# Patient Record
Sex: Male | Born: 1955 | Race: White | Hispanic: No | Marital: Married | Smoking: Never smoker
Health system: Southern US, Community
[De-identification: ages and names within clinical notes are randomized; demographics above are authoritative.]

---

## 2006-05-10 ENCOUNTER — Emergency Department (HOSPITAL_COMMUNITY): Admission: EM | Admit: 2006-05-10 | Discharge: 2006-05-10 | Payer: Self-pay | Admitting: Emergency Medicine
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
## 2014-10-29 DIAGNOSIS — Z800 Family history of malignant neoplasm of digestive organs: Secondary | ICD-10-CM | POA: Insufficient documentation

## 2015-03-24 DIAGNOSIS — D126 Benign neoplasm of colon, unspecified: Secondary | ICD-10-CM | POA: Insufficient documentation

## 2016-07-02 DIAGNOSIS — F4310 Post-traumatic stress disorder, unspecified: Secondary | ICD-10-CM | POA: Insufficient documentation

## 2017-02-04 DIAGNOSIS — M19022 Primary osteoarthritis, left elbow: Secondary | ICD-10-CM | POA: Insufficient documentation

## 2018-06-09 DIAGNOSIS — I10 Essential (primary) hypertension: Secondary | ICD-10-CM | POA: Insufficient documentation

## 2018-06-09 DIAGNOSIS — Z8673 Personal history of transient ischemic attack (TIA), and cerebral infarction without residual deficits: Secondary | ICD-10-CM | POA: Insufficient documentation

## 2018-09-24 DIAGNOSIS — G4733 Obstructive sleep apnea (adult) (pediatric): Secondary | ICD-10-CM | POA: Insufficient documentation

## 2018-09-24 DIAGNOSIS — I272 Pulmonary hypertension, unspecified: Secondary | ICD-10-CM | POA: Insufficient documentation

## 2019-04-08 DIAGNOSIS — Q2112 Patent foramen ovale: Secondary | ICD-10-CM | POA: Insufficient documentation

## 2019-04-08 DIAGNOSIS — Q211 Atrial septal defect: Secondary | ICD-10-CM | POA: Insufficient documentation

## 2019-09-02 DIAGNOSIS — R310 Gross hematuria: Secondary | ICD-10-CM | POA: Insufficient documentation

## 2019-09-02 DIAGNOSIS — N135 Crossing vessel and stricture of ureter without hydronephrosis: Secondary | ICD-10-CM | POA: Insufficient documentation

## 2019-09-25 DIAGNOSIS — N2889 Other specified disorders of kidney and ureter: Secondary | ICD-10-CM | POA: Insufficient documentation

## 2019-12-01 ENCOUNTER — Ambulatory Visit: Payer: Self-pay | Admitting: Orthopaedic Surgery

## 2019-12-08 ENCOUNTER — Encounter: Payer: Self-pay | Admitting: Orthopaedic Surgery

## 2019-12-08 ENCOUNTER — Ambulatory Visit (INDEPENDENT_AMBULATORY_CARE_PROVIDER_SITE_OTHER): Payer: Self-pay | Admitting: Orthopaedic Surgery

## 2019-12-08 ENCOUNTER — Other Ambulatory Visit: Payer: Self-pay

## 2019-12-08 VITALS — BP 145/83 | HR 50 | Ht 68.0 in | Wt 215.0 lb

## 2019-12-08 DIAGNOSIS — M19079 Primary osteoarthritis, unspecified ankle and foot: Secondary | ICD-10-CM | POA: Insufficient documentation

## 2019-12-08 DIAGNOSIS — M542 Cervicalgia: Secondary | ICD-10-CM

## 2019-12-08 DIAGNOSIS — M25522 Pain in left elbow: Secondary | ICD-10-CM

## 2019-12-08 DIAGNOSIS — M25512 Pain in left shoulder: Secondary | ICD-10-CM

## 2019-12-08 DIAGNOSIS — M722 Plantar fascial fibromatosis: Secondary | ICD-10-CM | POA: Insufficient documentation

## 2019-12-08 DIAGNOSIS — B0089 Other herpesviral infection: Secondary | ICD-10-CM | POA: Insufficient documentation

## 2019-12-08 DIAGNOSIS — G8929 Other chronic pain: Secondary | ICD-10-CM

## 2019-12-08 DIAGNOSIS — K2270 Barrett's esophagus without dysplasia: Secondary | ICD-10-CM | POA: Insufficient documentation

## 2019-12-08 DIAGNOSIS — Z1211 Encounter for screening for malignant neoplasm of colon: Secondary | ICD-10-CM | POA: Insufficient documentation

## 2019-12-08 NOTE — Progress Notes (Signed)
He was seen for separate second opinion and rating.    Separate letter written to attorney.  Electronically Signed Sanjuana Kava, MD 1/5/202111:01 AM

## 2020-11-30 ENCOUNTER — Telehealth: Payer: Self-pay | Admitting: Orthopaedic Surgery

## 2020-11-30 NOTE — Telephone Encounter (Signed)
Call via voice message received from [facility], attorney in [place], with request for deposition by Dr Hilda Lias, if he would approve. PH# [phone] I called back to law office, and reached [facility], who has provided her email information:  [email or web address]  - aware we will send message to our practice administrator Toniann Fail, for her to return call or email. Aware Dr Hilda Lias is also out of clinic and will be returning 12/06/20.

## 2020-12-06 NOTE — Telephone Encounter (Signed)
Please get all necessay info and ask Dr Hilda Lias and schedule per him.  Thanks.

## 2020-12-06 NOTE — Telephone Encounter (Signed)
Dr Hilda Lias, please review telephone note from attorney Monico Hoar, and advise if you approve another deposition via telephone.

## 2020-12-07 NOTE — Telephone Encounter (Signed)
Called back to [employer]'s office at #[phone] to speak with contact person Meriam Sprague; reached Selena Batten, who took message as Meriam Sprague is out of office this week. Will relay. Appointment pending.

## 2020-12-07 NOTE — Telephone Encounter (Signed)
I can do it.  Need to schedule it and allow time.  I need the paperwork.  Just let me know.

## 2020-12-12 NOTE — Telephone Encounter (Signed)
Call back from [facility] personally, [phone] - states we have the paperwork as far as medical records for patient in chart.   I gave general dates of Dr Joseph Dodson clinicTuesdays or Thursdays, possibly late morning. Mr. Joseph Dodson needs to know if this deposition would be in-person, and if so, where (e.g, our office, the hospital), or whether it may be by phone. Joseph Dodson asked for Dr Joseph Dodson to please provide 4 dates for him to check with the other attorney, and then he would get back in touch.  Mr Joseph Dodson relays that Dr Joseph Dodson has previously provided a report including a rating (report in chart).

## 2020-12-13 NOTE — Telephone Encounter (Signed)
Needs to be virtual/telephone.  No in person.

## 2020-12-15 NOTE — Telephone Encounter (Signed)
Called back to [employer] office [phone]; relayed message as noted; spoke with Maudie Mercury - will pass message on. Aware to schedule via telephone on a Tuesday or Thursday, preferably late morning 11:00am or after.

## 2020-12-21 NOTE — Telephone Encounter (Signed)
I spoke with Mr Joseph Dodson today, he will get with court reporter and the opposing atty and let us know when we can arrange a virtual (possibly zoom) meeting to do this deposition.   We have requested a Tues/Thur 11 am or a little after.   Mr Joseph Dodson will get back to me with a proposed date, giving a few weeks notice.   FYI for now.

## 2020-12-22 NOTE — Telephone Encounter (Signed)
I have this deposition scheduled for 11am on Feb 10th, as a phone call. Do you still have the notes that Hank sent over to [place]?  I know some notes are in epic, but he sent you some as well.  Just let me know.

## 2020-12-25 NOTE — Telephone Encounter (Signed)
I had them at one time but they were upstairs in my office and they may not be where I think they should be.  So, best he resends them again.

## 2020-12-26 NOTE — Telephone Encounter (Signed)
I called and s/w Rise Paganini and she will get message to Hank to resend notes, she will email them to me.

## 2021-01-03 NOTE — Telephone Encounter (Signed)
Notes placed on your desk. 

## 2021-01-12 ENCOUNTER — Ambulatory Visit: Payer: BC Managed Care – PPO | Admitting: Orthopaedic Surgery
